# Patient Record
Sex: Female | Born: 1950 | Race: White | Hispanic: No | Marital: Single | Smoking: Former smoker
Health system: Southern US, Community
[De-identification: ages and names within clinical notes are randomized; demographics above are authoritative.]

## PROBLEM LIST (undated history)

## (undated) DIAGNOSIS — I10 Essential (primary) hypertension: Secondary | ICD-10-CM

## (undated) DIAGNOSIS — M1990 Unspecified osteoarthritis, unspecified site: Secondary | ICD-10-CM

## (undated) DIAGNOSIS — E079 Disorder of thyroid, unspecified: Secondary | ICD-10-CM

## (undated) DIAGNOSIS — E7800 Pure hypercholesterolemia, unspecified: Secondary | ICD-10-CM

## (undated) DIAGNOSIS — E119 Type 2 diabetes mellitus without complications: Secondary | ICD-10-CM

---

## 1999-08-06 ENCOUNTER — Ambulatory Visit (HOSPITAL_COMMUNITY): Admission: RE | Admit: 1999-08-06 | Discharge: 1999-08-06 | Payer: Self-pay | Admitting: Gastroenterology

## 2000-03-05 ENCOUNTER — Encounter: Admission: RE | Admit: 2000-03-05 | Discharge: 2000-03-05 | Payer: Self-pay | Admitting: Family Medicine

## 2000-03-05 ENCOUNTER — Encounter: Payer: Self-pay | Admitting: Family Medicine

## 2000-05-26 ENCOUNTER — Encounter: Payer: Self-pay | Admitting: Family Medicine

## 2000-05-26 ENCOUNTER — Encounter: Admission: RE | Admit: 2000-05-26 | Discharge: 2000-05-26 | Payer: Self-pay | Admitting: Family Medicine

## 2000-06-12 ENCOUNTER — Encounter: Admission: RE | Admit: 2000-06-12 | Discharge: 2000-06-12 | Payer: Self-pay | Admitting: Family Medicine

## 2000-06-12 ENCOUNTER — Encounter: Payer: Self-pay | Admitting: Family Medicine

## 2001-04-26 ENCOUNTER — Encounter: Payer: Self-pay | Admitting: Family Medicine

## 2001-04-26 ENCOUNTER — Encounter: Admission: RE | Admit: 2001-04-26 | Discharge: 2001-04-26 | Payer: Self-pay | Admitting: Family Medicine

## 2002-04-27 ENCOUNTER — Encounter: Admission: RE | Admit: 2002-04-27 | Discharge: 2002-04-27 | Payer: Self-pay | Admitting: Family Medicine

## 2002-04-27 ENCOUNTER — Encounter: Payer: Self-pay | Admitting: Family Medicine

## 2002-05-02 ENCOUNTER — Encounter: Payer: Self-pay | Admitting: Family Medicine

## 2002-05-02 ENCOUNTER — Encounter: Admission: RE | Admit: 2002-05-02 | Discharge: 2002-05-02 | Payer: Self-pay | Admitting: Family Medicine

## 2002-06-30 ENCOUNTER — Other Ambulatory Visit: Admission: RE | Admit: 2002-06-30 | Discharge: 2002-06-30 | Payer: Self-pay | Admitting: Family Medicine

## 2002-10-19 ENCOUNTER — Encounter: Payer: Self-pay | Admitting: Family Medicine

## 2002-10-19 ENCOUNTER — Encounter: Admission: RE | Admit: 2002-10-19 | Discharge: 2002-10-19 | Payer: Self-pay | Admitting: Family Medicine

## 2003-05-05 ENCOUNTER — Encounter: Payer: Self-pay | Admitting: Family Medicine

## 2003-05-05 ENCOUNTER — Encounter: Admission: RE | Admit: 2003-05-05 | Discharge: 2003-05-05 | Payer: Self-pay | Admitting: Family Medicine

## 2004-05-10 ENCOUNTER — Encounter: Admission: RE | Admit: 2004-05-10 | Discharge: 2004-05-10 | Payer: Self-pay | Admitting: Family Medicine

## 2005-03-03 ENCOUNTER — Other Ambulatory Visit: Admission: RE | Admit: 2005-03-03 | Discharge: 2005-03-03 | Payer: Self-pay | Admitting: Family Medicine

## 2005-05-15 ENCOUNTER — Encounter: Admission: RE | Admit: 2005-05-15 | Discharge: 2005-05-15 | Payer: Self-pay | Admitting: Family Medicine

## 2006-05-06 ENCOUNTER — Encounter: Admission: RE | Admit: 2006-05-06 | Discharge: 2006-05-06 | Payer: Self-pay | Admitting: Family Medicine

## 2006-09-23 ENCOUNTER — Encounter: Admission: RE | Admit: 2006-09-23 | Discharge: 2006-09-23 | Payer: Self-pay | Admitting: Family Medicine

## 2007-05-10 ENCOUNTER — Encounter: Admission: RE | Admit: 2007-05-10 | Discharge: 2007-05-10 | Payer: Self-pay | Admitting: Family Medicine

## 2008-05-10 ENCOUNTER — Encounter: Admission: RE | Admit: 2008-05-10 | Discharge: 2008-05-10 | Payer: Self-pay | Admitting: Family Medicine

## 2009-05-11 ENCOUNTER — Encounter: Admission: RE | Admit: 2009-05-11 | Discharge: 2009-05-11 | Payer: Self-pay | Admitting: Family Medicine

## 2009-09-12 ENCOUNTER — Ambulatory Visit (HOSPITAL_COMMUNITY): Admission: RE | Admit: 2009-09-12 | Discharge: 2009-09-12 | Payer: Self-pay | Admitting: Family Medicine

## 2010-05-13 ENCOUNTER — Encounter: Admission: RE | Admit: 2010-05-13 | Discharge: 2010-05-13 | Payer: Self-pay | Admitting: Family Medicine

## 2011-04-14 ENCOUNTER — Other Ambulatory Visit: Payer: Self-pay | Admitting: Family Medicine

## 2011-04-14 DIAGNOSIS — Z1231 Encounter for screening mammogram for malignant neoplasm of breast: Secondary | ICD-10-CM

## 2011-05-23 ENCOUNTER — Ambulatory Visit
Admission: RE | Admit: 2011-05-23 | Discharge: 2011-05-23 | Disposition: A | Discharge status: Home / Self Care | Payer: PRIVATE HEALTH INSURANCE | Source: Ambulatory Visit | Attending: Family Medicine | Admitting: Family Medicine

## 2011-05-23 DIAGNOSIS — Z1231 Encounter for screening mammogram for malignant neoplasm of breast: Secondary | ICD-10-CM

## 2012-06-18 DIAGNOSIS — I10 Essential (primary) hypertension: Secondary | ICD-10-CM | POA: Insufficient documentation

## 2013-01-05 DIAGNOSIS — E785 Hyperlipidemia, unspecified: Secondary | ICD-10-CM | POA: Insufficient documentation

## 2013-01-05 DIAGNOSIS — K219 Gastro-esophageal reflux disease without esophagitis: Secondary | ICD-10-CM | POA: Insufficient documentation

## 2013-01-05 DIAGNOSIS — E039 Hypothyroidism, unspecified: Secondary | ICD-10-CM | POA: Insufficient documentation

## 2013-02-21 DIAGNOSIS — G8929 Other chronic pain: Secondary | ICD-10-CM | POA: Insufficient documentation

## 2013-04-20 ENCOUNTER — Other Ambulatory Visit: Payer: Self-pay

## 2013-04-20 DIAGNOSIS — Z1231 Encounter for screening mammogram for malignant neoplasm of breast: Secondary | ICD-10-CM

## 2013-05-17 ENCOUNTER — Ambulatory Visit: Payer: PRIVATE HEALTH INSURANCE

## 2013-06-08 ENCOUNTER — Ambulatory Visit
Admission: RE | Admit: 2013-06-08 | Discharge: 2013-06-08 | Disposition: A | Discharge status: Home / Self Care | Payer: PRIVATE HEALTH INSURANCE | Source: Ambulatory Visit

## 2013-06-08 DIAGNOSIS — Z1231 Encounter for screening mammogram for malignant neoplasm of breast: Secondary | ICD-10-CM

## 2014-04-21 ENCOUNTER — Other Ambulatory Visit: Payer: Self-pay

## 2014-04-21 DIAGNOSIS — Z1239 Encounter for other screening for malignant neoplasm of breast: Secondary | ICD-10-CM

## 2014-06-12 ENCOUNTER — Ambulatory Visit
Admission: RE | Admit: 2014-06-12 | Discharge: 2014-06-12 | Disposition: A | Discharge status: Home / Self Care | Payer: PRIVATE HEALTH INSURANCE | Source: Ambulatory Visit

## 2014-06-12 ENCOUNTER — Other Ambulatory Visit: Payer: Self-pay

## 2014-06-12 DIAGNOSIS — Z1231 Encounter for screening mammogram for malignant neoplasm of breast: Secondary | ICD-10-CM

## 2014-11-08 DIAGNOSIS — M8580 Other specified disorders of bone density and structure, unspecified site: Secondary | ICD-10-CM | POA: Insufficient documentation

## 2015-09-03 ENCOUNTER — Other Ambulatory Visit: Payer: Self-pay

## 2015-09-03 DIAGNOSIS — Z1231 Encounter for screening mammogram for malignant neoplasm of breast: Secondary | ICD-10-CM

## 2015-09-24 ENCOUNTER — Ambulatory Visit
Admission: RE | Admit: 2015-09-24 | Discharge: 2015-09-24 | Disposition: A | Discharge status: Home / Self Care | Payer: PRIVATE HEALTH INSURANCE | Source: Ambulatory Visit

## 2015-09-24 DIAGNOSIS — Z1231 Encounter for screening mammogram for malignant neoplasm of breast: Secondary | ICD-10-CM

## 2015-12-22 ENCOUNTER — Other Ambulatory Visit: Payer: Self-pay | Admitting: Family Medicine

## 2015-12-22 DIAGNOSIS — E2839 Other primary ovarian failure: Secondary | ICD-10-CM

## 2015-12-22 DIAGNOSIS — Z780 Asymptomatic menopausal state: Secondary | ICD-10-CM

## 2016-01-08 ENCOUNTER — Ambulatory Visit
Admission: RE | Admit: 2016-01-08 | Discharge: 2016-01-08 | Disposition: A | Discharge status: Home / Self Care | Payer: PRIVATE HEALTH INSURANCE | Source: Ambulatory Visit | Attending: Family Medicine | Admitting: Family Medicine

## 2016-01-08 DIAGNOSIS — E2839 Other primary ovarian failure: Secondary | ICD-10-CM

## 2016-01-08 DIAGNOSIS — Z780 Asymptomatic menopausal state: Secondary | ICD-10-CM

## 2016-10-06 ENCOUNTER — Other Ambulatory Visit: Payer: Self-pay | Admitting: Family Medicine

## 2016-10-06 DIAGNOSIS — Z1231 Encounter for screening mammogram for malignant neoplasm of breast: Secondary | ICD-10-CM

## 2016-10-23 ENCOUNTER — Ambulatory Visit
Admission: RE | Admit: 2016-10-23 | Discharge: 2016-10-23 | Disposition: A | Discharge status: Home / Self Care | Payer: PRIVATE HEALTH INSURANCE | Source: Ambulatory Visit | Attending: Family Medicine | Admitting: Family Medicine

## 2016-10-23 DIAGNOSIS — Z1231 Encounter for screening mammogram for malignant neoplasm of breast: Secondary | ICD-10-CM

## 2017-12-15 ENCOUNTER — Other Ambulatory Visit: Payer: Self-pay | Admitting: Family Medicine

## 2017-12-15 DIAGNOSIS — Z1231 Encounter for screening mammogram for malignant neoplasm of breast: Secondary | ICD-10-CM

## 2018-01-04 ENCOUNTER — Ambulatory Visit: Payer: PRIVATE HEALTH INSURANCE

## 2018-01-20 ENCOUNTER — Ambulatory Visit
Admission: RE | Admit: 2018-01-20 | Discharge: 2018-01-20 | Disposition: A | Discharge status: Home / Self Care | Payer: PRIVATE HEALTH INSURANCE | Source: Ambulatory Visit | Attending: Family Medicine | Admitting: Family Medicine

## 2018-01-20 DIAGNOSIS — Z1231 Encounter for screening mammogram for malignant neoplasm of breast: Secondary | ICD-10-CM

## 2018-07-20 ENCOUNTER — Ambulatory Visit: Payer: Self-pay | Admitting: Nurse Practitioner

## 2018-07-20 VITALS — BP 128/70 | HR 66 | Temp 98.5°F | Resp 16

## 2018-07-20 DIAGNOSIS — I10 Essential (primary) hypertension: Secondary | ICD-10-CM

## 2018-07-20 DIAGNOSIS — R071 Chest pain on breathing: Secondary | ICD-10-CM

## 2018-07-20 DIAGNOSIS — S6991XA Unspecified injury of right wrist, hand and finger(s), initial encounter: Secondary | ICD-10-CM

## 2018-07-20 DIAGNOSIS — R0789 Other chest pain: Secondary | ICD-10-CM

## 2018-07-20 MED ORDER — LISINOPRIL-HYDROCHLOROTHIAZIDE 20-25 MG PO TABS: 1.0000 | ORAL_TABLET | Freq: Every day | ORAL | 0 refills | Status: AC

## 2018-07-20 MED ORDER — PREDNISONE 20 MG PO TABS: ORAL_TABLET | ORAL | 0 refills | Status: DC

## 2018-07-20 NOTE — Patient Instructions (Addendum)
Valerie Simpson I will send over a 90 day supply of your Lisinopril/HCTZ so that you don't go without your med As discussed, there definitely is some inflammation and we will trial a 5 day course of prednisone to see if you get relief; if no please consider seeing a neurology as I think the intercostal nerves may be affected from your incident years ago Ice your finger and continue your Meloxican as directed We discussed your history of prednisone injections causing you to have a menses and you wish to do a short trial to see if the rib cage discomfort will help Return to the clinic as needed or if symptoms worsen or persist

## 2018-07-20 NOTE — Progress Notes (Signed)
   Subjective:    Patient ID: Valerie Simpson, female    DOB: 1950-07-31, 68 y.o.   MRN: [id number]  HPI Valerie Simpson comes to the health and wellness clinic today with reports of an injury this morning where the dog leash got wrapped around her right 5th digit and she wants to make sure it isn't broken. She also request a refill on her blood pressure medication which she takes as directed and took last dose this morning. Admits has called PCP office but hasn't been done yet and waiting to have med sent to pharmacy. C/o ongoing pain to right lower rib cage that has been years. She reports it occurred 2012 when she had the flu and developed costochondritis, but has never resolved. Admits has only seen different providers at office she goes to and hasn't seen neurology in the past. She reports the pain feels like a sharp rubbing pain that is constant. Discussed med list and meds she has been put on for fibromyalgia like Gabapentin which she is no longer on and Cymbalta which has helped with fibro, but not her right lower rib cage pain. She reports she takes Meloxicam for OA and this hasn't helped that area either. Reports right lateral discomfort that sometimes radiates to the medial area but does not cross the chest wall and able to palpate area. On omeprazole for GERD. Admits has done steroids in knees and started menses.   Review of Systems  Constitutional: Negative for fatigue and fever.  Cardiovascular: Positive for chest pain. Negative for palpitations.  Musculoskeletal: Positive for arthralgias and myalgias. Negative for gait problem, neck pain and neck stiffness.  Skin: Negative for color change and rash.  Neurological: Negative for numbness.       Objective:   Physical Exam Vitals signs reviewed.  Constitutional:      Appearance: Normal appearance. She is well-developed. She is obese.  HENT:     Head: Normocephalic.  Neck:     Musculoskeletal: Normal range of motion.  Pulmonary:     Effort:  No respiratory distress.  Abdominal:     General: Bowel sounds are normal.     Palpations: Abdomen is soft.  Musculoskeletal: Normal range of motion.        General: Tenderness present.     Comments: Tenderness over right anterior 12th rib on palpation. No edema or discoloration noted. No tenderness on cupping the liver.  Right 5th digit with no discoloation or ecchymosis. Digit is smooth with no nodules. Some tenderness on palpation but FROM and strenght 4/5.  Skin:    General: Skin is warm and dry.  Neurological:     Mental Status: She is alert and oriented to person, place, and time.           Assessment & Plan:

## 2018-08-03 ENCOUNTER — Ambulatory Visit: Payer: Self-pay | Admitting: Nurse Practitioner

## 2018-08-03 VITALS — BP 122/68 | HR 77 | Temp 99.5°F | Resp 16

## 2018-08-03 DIAGNOSIS — J209 Acute bronchitis, unspecified: Secondary | ICD-10-CM

## 2018-08-03 DIAGNOSIS — H66003 Acute suppurative otitis media without spontaneous rupture of ear drum, bilateral: Secondary | ICD-10-CM

## 2018-08-03 MED ORDER — BENZONATATE 100 MG PO CAPS: ORAL_CAPSULE | ORAL | 0 refills | Status: DC

## 2018-08-03 MED ORDER — AZITHROMYCIN 250 MG PO TABS: ORAL_TABLET | ORAL | 0 refills | Status: DC

## 2018-08-03 NOTE — Progress Notes (Signed)
   Subjective:    Patient ID: Valerie Simpson, female    DOB: 1951/02/05, 68 y.o.   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 She admits over the last day the cough has worsened and moved into her chest. In the morning she produces yellow phelgm and it's a constant dry cough during the day. She has received her flu vaccine. She admits she has been taking delsym and cough drops with short term relief. She denies fever, wheezing, nasal congestion, sore throat, facial pain, body aches, myalgia or headaches.   Review of Systems  Constitutional: Positive for fever. Negative for fatigue.  HENT: Negative for congestion, ear pain and postnasal drip.   Respiratory: Positive for cough. Negative for shortness of breath and wheezing.   Cardiovascular: Positive for chest pain.       Chronic right lower rib cage pain  Neurological: Negative for dizziness and headaches.       Objective:   Physical Exam Vitals signs reviewed.  Constitutional:      General: She is not in acute distress.    Appearance: She is well-developed. She is obese. She is ill-appearing.  HENT:     Head: Normocephalic and atraumatic.     Right Ear: Ear canal normal.     Left Ear: Ear canal normal.     Ears:     Comments: Bilateral ears with cloudy fluid behind intact TM which is mildly injected. Unable to visualize ear bone structures.    Nose: Nose normal.     Mouth/Throat:     Mouth: Mucous membranes are dry.     Comments: Mildly injected Neck:     Musculoskeletal: Normal range of motion and neck supple.     Comments: No tenderness to bilateral upper anterior cervical lymphadenopathy Cardiovascular:     Rate and Rhythm: Normal rate and regular rhythm.     Heart sounds: Normal heart sounds.  Pulmonary:     Effort: Pulmonary effort is normal. No respiratory distress.     Breath sounds: No stridor. No rhonchi.     Comments: Diminished lung sounds  throughout and cough ellicited with deep inspiration Abdominal:     General: Bowel sounds are normal.     Palpations: Abdomen is soft.  Musculoskeletal: Normal range of motion.  Lymphadenopathy:     Cervical: Cervical adenopathy present.  Skin:    General: Skin is warm and dry.  Neurological:     Mental Status: She is alert and oriented to person, place, and time.  Psychiatric:        Mood and Affect: Mood normal.           Assessment & Plan:

## 2018-08-03 NOTE — Patient Instructions (Addendum)
Start over the counter claritin and take as directed to try and help with drainage Take all meds as directed Can take tylenol if it provides relief of discomfort from constant coughing Can continue cough drops if gives some relief Hot tea with honey and lemon encouraged Continue to drink plenty of fluids, rest and handwashing discussed and to continue Return to clinic or go to urgent care if symptoms persist or worsen after course of treatment

## 2018-08-10 ENCOUNTER — Ambulatory Visit: Payer: Self-pay | Admitting: Nurse Practitioner

## 2018-08-10 VITALS — BP 140/88 | HR 62 | Temp 98.1°F | Resp 16

## 2018-08-10 DIAGNOSIS — J209 Acute bronchitis, unspecified: Secondary | ICD-10-CM

## 2018-08-10 MED ORDER — ALBUTEROL SULFATE HFA 108 (90 BASE) MCG/ACT IN AERS: INHALATION_SPRAY | RESPIRATORY_TRACT | 0 refills | Status: DC

## 2018-08-10 NOTE — Progress Notes (Addendum)
   Subjective:    Patient ID: Valerie Simpson, female    DOB: Sep 25, 1950, 68 y.o.   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 She has taken all of the abx. Continues to take claritin and tessalon perrrles. She reports she feels much better but wheezing started and now has bilateral ear pain. Cough has improved. She denies fever, c/p or sore throat.    Review of Systems  Constitutional: Negative for fever.  HENT: Positive for ear pain.   Respiratory: Positive for cough and wheezing. Negative for shortness of breath.   Cardiovascular: Negative for chest pain.       Objective:   Physical Exam Vitals signs reviewed.  Constitutional:      Appearance: She is well-developed.  HENT:     Head: Normocephalic and atraumatic.     Comments: Bilateral ears with clear serous fluid    Right Ear: Ear canal normal.     Left Ear: Ear canal normal.  Neck:     Musculoskeletal: Normal range of motion.  Cardiovascular:     Rate and Rhythm: Normal rate and regular rhythm.     Heart sounds: Normal heart sounds.  Pulmonary:     Effort: Pulmonary effort is normal. No respiratory distress.     Breath sounds: Rhonchi present. No wheezing.  Chest:     Chest wall: No tenderness.  Abdominal:     General: Bowel sounds are normal.     Palpations: Abdomen is soft.  Musculoskeletal: Normal range of motion.  Lymphadenopathy:     Cervical: Cervical adenopathy present.  Skin:    General: Skin is warm and dry.  Neurological:     Mental Status: She is alert and oriented to person, place, and time.     40 mg of kenalog IM administered by provider to right gluteal. Patient tolerated injection     Assessment & Plan:

## 2018-08-10 NOTE — Patient Instructions (Signed)
To continue Claritin and Tessalon Perrles as directed Continue cough drops Increase fluid intake and continue hot tea with honey and lemon Pro-air sent to pharmacy and discussed side effects; kenalog injection given in office today and discussed risks/benefits/side effects Return to clinic in 48 hours if no improvement in symptoms or worsening

## 2018-08-12 MED ORDER — TRIAMCINOLONE ACETONIDE 40 MG/ML IJ SUSP: 40.0000 mg | Freq: Once | INTRAMUSCULAR | Status: AC

## 2018-08-12 NOTE — Addendum Note (Signed)
Addended by: Tresea Mall on: 08/12/2018 11:28 AM   Modules accepted: Orders

## 2018-10-07 ENCOUNTER — Ambulatory Visit: Payer: Self-pay | Admitting: Family Medicine

## 2018-10-07 ENCOUNTER — Encounter: Payer: Self-pay | Admitting: Family Medicine

## 2018-10-07 DIAGNOSIS — H65493 Other chronic nonsuppurative otitis media, bilateral: Secondary | ICD-10-CM

## 2018-10-07 DIAGNOSIS — J309 Allergic rhinitis, unspecified: Secondary | ICD-10-CM

## 2018-10-07 NOTE — Patient Instructions (Signed)

## 2018-10-07 NOTE — Progress Notes (Signed)
Valerie Simpson is a 68 y.o. female who presents today with 14 days of ear pressure and fullness. Patient was seen for this condition on 2 occasions in the month of January. She was initially treated with azithromycin and tessalon perles for an otitis with bronchitis, patient completed course with only mild improvement and after a week received IM triamcinolone and albuterol for continued symptoms and also advised to take . She reports that she has had to modify the previous treatment plan to Claritin every other day due to feeling very dry in her nose and throat and she admits she is not using the Flonase very regularly at home. As far as allergen exposure goes she does have known seasonal allergies, [place], and she likes to sit outdoors with her pets.     Review of Systems  Constitutional: Negative for chills, fever and malaise/fatigue.  HENT: Negative for congestion, ear discharge, ear pain, hearing loss, nosebleeds, sinus pain, sore throat and tinnitus.        Bilateral ear fullness- denies pain, draining, popping, discharge, tinnitus, or hearing loss.  Eyes: Negative.  Negative for blurred vision, double vision, photophobia, pain, discharge and redness.  Respiratory: Negative for cough, sputum production, shortness of breath and wheezing.   Cardiovascular: Negative.  Negative for chest pain.  Gastrointestinal: Negative for abdominal pain, diarrhea, nausea and vomiting.  Genitourinary: Negative for dysuria, frequency, hematuria and urgency.  Musculoskeletal: Negative for myalgias.  Skin: Negative.  Negative for itching and rash.  Neurological: Negative for headaches.  Endo/Heme/Allergies: Negative.   Psychiatric/Behavioral: Negative.   All other systems reviewed and are negative.   Bethsaida has a current medication list which includes the following prescription(s): albuterol, azithromycin, benzonatate, calcium citrate-vitamin d, vitamin d3, coenzyme q10, conjugated  estrogens, duloxetine, garlic, levothyroxine, lisinopril-hydrochlorothiazide, meloxicam, metoprolol tartrate, multivitamin, omega-3, omeprazole, potassium, prednisone, and simvastatin, and the following Facility-Administered Medications: triamcinolone acetonide. Also is allergic to penicillins; other; and sulfa antibiotics.  [name]  has no past medical history on file. Also  has no past surgical history on file.    O: Vitals:   10/07/18 1002  BP: (!) 152/82  Pulse: 76  Resp: 16  Temp: 99 F (37.2 C)     Physical Exam Vitals signs reviewed.  Constitutional:      Appearance: She is well-developed. She is not toxic-appearing.  HENT:     Head: Normocephalic.     Right Ear: Hearing, ear canal and external ear normal. No tenderness. A middle ear effusion is present. Tympanic membrane is not injected, erythematous, retracted or bulging.     Left Ear: Hearing, ear canal and external ear normal. No tenderness. A middle ear effusion is present. Tympanic membrane is not injected, erythematous, retracted or bulging.     Ears:     Comments: Some evidence of cone of light bilaterally but reduced bony landmarks through increased erythema and evidence of air pockets throughout- no pain on insertion and canal otherwise WNL- no evidence of acute perforation, bulging or other otic chronic changes- no discharge, drainage, heavy cerumen or impaction noted.    Nose: Rhinorrhea present. No congestion. Rhinorrhea is clear.     Right Sinus: No maxillary sinus tenderness or frontal sinus tenderness.     Left Sinus: No maxillary sinus tenderness or frontal sinus tenderness.     Mouth/Throat:     Lips: Pink.     Mouth: Mucous membranes are moist.     Pharynx: Uvula midline.  Eyes:  General: Lids are normal. No allergic shiner.    Conjunctiva/sclera: Conjunctivae normal.  Neck:     Musculoskeletal: Normal range of motion and neck supple.  Cardiovascular:     Rate and Rhythm: Normal rate and regular  rhythm.     Pulses: Normal pulses.     Heart sounds: Normal heart sounds.  Pulmonary:     Effort: Pulmonary effort is normal.     Breath sounds: Normal breath sounds.  Abdominal:     General: Bowel sounds are normal.     Palpations: Abdomen is soft.  Musculoskeletal: Normal range of motion.  Lymphadenopathy:     Head:     Right side of head: Tonsillar adenopathy present. No submental or submandibular adenopathy.     Left side of head: Tonsillar adenopathy present. No submental or submandibular adenopathy.     Cervical: Cervical adenopathy present.     Right cervical: Superficial cervical adenopathy present.     Left cervical: Superficial cervical adenopathy present.  Neurological:     Mental Status: She is alert and oriented to person, place, and time.    A: 1. Allergic rhinitis, unspecified seasonality, unspecified trigger   2. 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 Allergic rhinitis, unspecified seasonality, unspecified trigger Allegra 60 mg BID x 14 days trial Routine use of Flonase at least daily.  2. Chronic MEE (middle ear effusion), bilateral Discussed the importance of routine use of medications for allergies especially during allergy season. Discussed changing Claritin for Allegra which may create a less drying effect. Discussed low starting dose of 60 mg BID trial for 14 days in conjunction with Flonase routinely at least 1 spray BID or 2 sprays daily. Discussed with patient hesitance to escalate care (refer to ENT, consider another round of steroids, consider abx) if first line treatment plan has not been executed effectively. Goal is to see if routine use of antihistamines will improve symptoms. Next steps considerations include: trial of Astelin or short course decongestant, referral to ENT and now symptoms or ear process is chronic (lasting longer than 90 days) and failed conservative treatments. All of this was discussed with patient who agrees to  trial plan for seasonal allergies with increased compliance, wear a mask outside when exposed to environmental allergens, and try to reduce exposure to environmental allergens.   Discussed with patient exam findings, suspected diagnosis etiology and  reviewed recommended treatment plan and follow up, including complications and indications for urgent medical follow up and evaluation. Medications including use and indications reviewed with patient. Patient provided relevant patient education on diagnosis and/or relevant related condition that were discussed and reviewed with patient at discharge. Patient verbalized understanding of information provided and agrees with plan of care (POC), all questions answered.

## 2019-01-18 ENCOUNTER — Other Ambulatory Visit: Payer: Self-pay | Admitting: Family Medicine

## 2019-01-18 DIAGNOSIS — Z1231 Encounter for screening mammogram for malignant neoplasm of breast: Secondary | ICD-10-CM

## 2019-01-19 ENCOUNTER — Other Ambulatory Visit: Payer: Self-pay | Admitting: Family Medicine

## 2019-01-19 DIAGNOSIS — E2839 Other primary ovarian failure: Secondary | ICD-10-CM

## 2019-03-02 ENCOUNTER — Other Ambulatory Visit: Payer: Self-pay

## 2019-03-02 ENCOUNTER — Ambulatory Visit
Admission: RE | Admit: 2019-03-02 | Discharge: 2019-03-02 | Disposition: A | Discharge status: Home / Self Care | Payer: PRIVATE HEALTH INSURANCE | Source: Ambulatory Visit | Attending: Family Medicine | Admitting: Family Medicine

## 2019-03-02 DIAGNOSIS — Z1231 Encounter for screening mammogram for malignant neoplasm of breast: Secondary | ICD-10-CM

## 2019-03-07 ENCOUNTER — Ambulatory Visit
Admission: RE | Admit: 2019-03-07 | Discharge: 2019-03-07 | Disposition: A | Discharge status: Home / Self Care | Payer: PRIVATE HEALTH INSURANCE | Source: Ambulatory Visit | Attending: Family Medicine | Admitting: Family Medicine

## 2019-03-07 ENCOUNTER — Other Ambulatory Visit: Payer: Self-pay

## 2019-03-07 DIAGNOSIS — E2839 Other primary ovarian failure: Secondary | ICD-10-CM

## 2019-05-10 ENCOUNTER — Ambulatory Visit: Payer: PRIVATE HEALTH INSURANCE | Admitting: Family Medicine

## 2019-05-10 VITALS — BP 148/82 | HR 58 | Resp 16

## 2019-05-10 DIAGNOSIS — M79672 Pain in left foot: Secondary | ICD-10-CM

## 2019-05-10 DIAGNOSIS — Z4802 Encounter for removal of sutures: Secondary | ICD-10-CM

## 2019-05-10 DIAGNOSIS — I10 Essential (primary) hypertension: Secondary | ICD-10-CM

## 2019-05-10 NOTE — Progress Notes (Signed)
Subjective:     Patient ID: Valerie Simpson, female   DOB: 03/10/1951, 68 y.o.   MRN: [id number]  HPI  Valerie Simpson presents to the employee health clinic today for a check on her stitches. She reports she had a cyst removed by her dermatologist about a month ago to the crown of her head. Had a f/u appointment with them for suture removal. States since then still feels like there's a suture left but she is unable to visualize area. No pain or discharge to area. No fevers.   Valerie Simpson also reports some discomfort to left heel that has been ongoing for months. She is taking her meloxicam with no improvement. Pain to achilles area, tender to palpation and causes problems with walking. No known injury.    Patient Active Problem List   Diagnosis Date Noted  . Osteopenia 11/08/2014  . Chronic chest wall pain 02/21/2013  . GERD (gastroesophageal reflux disease) 01/05/2013  . Hyperlipidemia, unspecified 01/05/2013  . Hypothyroidism, unspecified 01/05/2013  . Essential (primary) hypertension 06/18/2012   Allergies  Allergen Reactions  . Penicillins Itching and Rash  . Other Rash    PAPER TAPE PAPER TAPE PAPER TAPE PAPER TAPE   . Sulfa Antibiotics Itching and Other (See Comments)    States placed on chart       Review of Systems  Constitutional: Negative for chills and fever.  Musculoskeletal: Negative for gait problem and joint swelling.  Skin: Negative for rash and wound.  Neurological: Negative for numbness.       Objective:   Physical Exam Vitals signs reviewed.  Constitutional:      General: She is not in acute distress.    Appearance: She is obese. She is not toxic-appearing.  HENT:     Head: Normocephalic and atraumatic.  Pulmonary:     Effort: Pulmonary effort is normal. No respiratory distress.  Musculoskeletal:        General: No swelling.     Comments: Left heel with tenderness over achilles tendon. No swelling, erythema, or ecchymosis. ROM intact. Normal gait. No ankle  pain.   Skin:    General: Skin is warm and dry.     Comments: Well healed area to crown of head, no evidence of infection, no surrounding erythema or purulent discharge noted. Small suture visualized that was still attached to patient's hair, easily removed.   Neurological:     Mental Status: She is alert and oriented to person, place, and time.  Psychiatric:        Mood and Affect: Mood normal.        Behavior: Behavior normal.        Assessment:     1. Visit for suture removal  2. Pain of left heel  3. Essential Hypertension     Plan:     1. Visible suture from cyst removal easily removed, no evidence of infection. Area appears well healed. F/u as needed.   2. Left heel pain likely inflammation of achilles tendon. Continue her meloxicam. Recommend ice application, rest, stretches. Start walking regimen slowly and increase as tolerated. F/u in 2 weeks if no improvement. At that point may consider referral to ortho or physical therapy.   3. Elevated BP in office today. Pt compliant with medication regimen. States BP normally around 140/80. Pt to schedule appointment with her PCP to f/u on this. Recommend she keep a log of her BP to bring with her to this appointment. If BP significantly elevated and symptomatic she should  go to ED. Pt verbalized understanding.

## 2019-09-20 ENCOUNTER — Encounter: Payer: Self-pay | Admitting: Family Medicine

## 2019-09-20 ENCOUNTER — Ambulatory Visit: Payer: PRIVATE HEALTH INSURANCE | Admitting: Family Medicine

## 2019-09-20 VITALS — BP 156/90 | HR 59

## 2019-09-20 DIAGNOSIS — Z789 Other specified health status: Secondary | ICD-10-CM

## 2019-09-20 NOTE — Progress Notes (Signed)
Subjective:     Patient ID: Valerie Simpson, female   DOB: 1950/10/24, 69 y.o.   MRN: [id number]  HPI Valerie Simpson presents to the employee health clinic for her required wellness visit for her insurance. Her PCP is Dr. Earnest Bailey, who she sees yearly for her physical. Last seen in June 2020, had lab work completed then. She reports her BP is often in the 140s / 80s at home. She is compliant with her medication. She is working on getting back into a walking routine, she had issues with achilles tendonitis that she received treatment for. She denies any problems or concerns today. It has been greater than 10 years since her last colonoscopy, she states she will schedule this to be done this year. Reports her mammogram is UTD. She no longer needs pap smear done.   No past medical history on file. Allergies  Allergen Reactions  . Penicillins Itching and Rash  . Other Rash    PAPER TAPE PAPER TAPE PAPER TAPE PAPER TAPE   . Levaquin [Levofloxacin] Other (See Comments)    Abdominal pain  . Sulfa Antibiotics Itching and Other (See Comments)    States placed on chart     Current Outpatient Medications:  .  aspirin EC 81 MG tablet, Take 81 mg by mouth daily., Disp: , Rfl:  .  Boswellia-Glucosamine-Vit D (OSTEO BI-FLEX ONE PER DAY PO), Take by mouth., Disp: , Rfl:  .  calcium citrate-vitamin D (CITRACAL+D) 315-200 MG-UNIT tablet, Take by mouth., Disp: , Rfl:  .  Cholecalciferol (VITAMIN D3) 25 MCG (1000 UT) CAPS, Take by mouth., Disp: , Rfl:  .  Coenzyme Q10 200 MG capsule, Take by mouth., Disp: , Rfl:  .  DULoxetine (CYMBALTA) 30 MG capsule, Take by mouth., Disp: , Rfl:  .  Garlic (GARLIQUE) 400 MG TBEC, Take by mouth., Disp: , Rfl:  .  levothyroxine (SYNTHROID, LEVOTHROID) 100 MCG tablet, Take by mouth., Disp: , Rfl:  .  meloxicam (MOBIC) 15 MG tablet, Take by mouth., Disp: , Rfl:  .  metoprolol tartrate (LOPRESSOR) 50 MG tablet, TAKE ONE TABLET BY MOUTH TWICE A DAY, Disp: , Rfl:  .  Multiple  Vitamin (MULTIVITAMIN) capsule, Take by mouth., Disp: , Rfl:  .  Omega-3 1000 MG CAPS, Take by mouth., Disp: , Rfl:  .  omeprazole (PRILOSEC) 20 MG capsule, Take by mouth., Disp: , Rfl:  .  Potassium 99 MG TABS, Take by mouth., Disp: , Rfl:  .  Probiotic Product (PROBIOTIC ADVANCED PO), Take by mouth., Disp: , Rfl:  .  simvastatin (ZOCOR) 40 MG tablet, TAKE ONE TABLET BY MOUTH EVERY NIGHT AT BEDTIME, Disp: , Rfl:  .  TURMERIC CURCUMIN PO, Take by mouth., Disp: , Rfl:  .  vitamin C (ASCORBIC ACID) 250 MG tablet, Take 250 mg by mouth daily., Disp: , Rfl:  .  conjugated estrogens (PREMARIN) vaginal cream, Place vaginally., Disp: , Rfl:  .  lisinopril-hydrochlorothiazide (PRINZIDE,ZESTORETIC) 20-25 MG tablet, Take 1 tablet by mouth daily., Disp: 90 tablet, Rfl: 0  Current Facility-Administered Medications:  .  triamcinolone acetonide (KENALOG-40) injection 40 mg, 40 mg, Intramuscular, Once, Bartorelli, Monica, NP  Review of Systems  Constitutional: Negative for chills, fatigue, fever and unexpected weight change.  HENT: Negative for congestion, ear pain, sinus pressure, sinus pain and sore throat.   Eyes: Negative for discharge and visual disturbance.  Respiratory: Negative for cough, shortness of breath and wheezing.   Cardiovascular: Negative for chest pain and leg swelling.  Gastrointestinal: Negative  for abdominal pain, blood in stool, constipation, diarrhea, nausea and vomiting.  Genitourinary: Negative for difficulty urinating and hematuria.  Skin: Negative for color change.  Neurological: Negative for dizziness, weakness, light-headedness and headaches.  Hematological: Negative for adenopathy.  All other systems reviewed and are negative.      Objective:   Physical Exam Vitals reviewed.  Constitutional:      General: She is not in acute distress.    Appearance: Normal appearance. She is well-developed.  HENT:     Head: Normocephalic and atraumatic.  Cardiovascular:     Rate  and Rhythm: Normal rate and regular rhythm.     Heart sounds: Normal heart sounds. No murmur.  Pulmonary:     Effort: Pulmonary effort is normal. No respiratory distress.     Breath sounds: Normal breath sounds.  Musculoskeletal:     Cervical back: Neck supple.  Skin:    General: Skin is warm and dry.  Neurological:     Mental Status: She is alert and oriented to person, place, and time.  Psychiatric:        Mood and Affect: Mood normal.        Behavior: Behavior normal.        Assessment:     Participant in health and wellness plan      Plan:     1. Keep all appts with PCP.  2. If BP remains elevated at home in the 140s she should contact PCP and may need some medication adjustment to keep this under better control. Encouraged healthy diet and exercise.  3. F/u here prn.

## 2020-04-25 ENCOUNTER — Other Ambulatory Visit: Payer: Self-pay | Admitting: Family Medicine

## 2020-04-25 DIAGNOSIS — Z1231 Encounter for screening mammogram for malignant neoplasm of breast: Secondary | ICD-10-CM

## 2020-05-18 ENCOUNTER — Ambulatory Visit: Payer: PRIVATE HEALTH INSURANCE

## 2020-06-26 ENCOUNTER — Other Ambulatory Visit: Payer: Self-pay

## 2020-06-26 ENCOUNTER — Ambulatory Visit
Admission: RE | Admit: 2020-06-26 | Discharge: 2020-06-26 | Disposition: A | Discharge status: Home / Self Care | Payer: BC Managed Care – PPO | Source: Ambulatory Visit | Attending: Family Medicine | Admitting: Family Medicine

## 2020-06-26 DIAGNOSIS — Z1231 Encounter for screening mammogram for malignant neoplasm of breast: Secondary | ICD-10-CM

## 2021-09-18 ENCOUNTER — Other Ambulatory Visit: Payer: Self-pay | Admitting: Family Medicine

## 2021-09-18 DIAGNOSIS — Z1231 Encounter for screening mammogram for malignant neoplasm of breast: Secondary | ICD-10-CM

## 2021-10-02 ENCOUNTER — Ambulatory Visit
Admission: RE | Admit: 2021-10-02 | Discharge: 2021-10-02 | Disposition: A | Discharge status: Home / Self Care | Payer: Medicare Other | Source: Ambulatory Visit | Attending: Family Medicine | Admitting: Family Medicine

## 2021-10-02 DIAGNOSIS — Z1231 Encounter for screening mammogram for malignant neoplasm of breast: Secondary | ICD-10-CM

## 2021-12-08 ENCOUNTER — Other Ambulatory Visit: Payer: Self-pay

## 2021-12-08 ENCOUNTER — Emergency Department (HOSPITAL_BASED_OUTPATIENT_CLINIC_OR_DEPARTMENT_OTHER)
Admission: EM | Admit: 2021-12-08 | Discharge: 2021-12-08 | Disposition: A | Discharge status: Home / Self Care | Payer: Medicare Other | Attending: Emergency Medicine | Admitting: Emergency Medicine

## 2021-12-08 ENCOUNTER — Encounter (HOSPITAL_BASED_OUTPATIENT_CLINIC_OR_DEPARTMENT_OTHER): Payer: Self-pay | Admitting: Emergency Medicine

## 2021-12-08 DIAGNOSIS — Z7982 Long term (current) use of aspirin: Secondary | ICD-10-CM | POA: Diagnosis not present

## 2021-12-08 DIAGNOSIS — S61210A Laceration without foreign body of right index finger without damage to nail, initial encounter: Secondary | ICD-10-CM | POA: Diagnosis not present

## 2021-12-08 DIAGNOSIS — W268XXA Contact with other sharp object(s), not elsewhere classified, initial encounter: Secondary | ICD-10-CM | POA: Diagnosis not present

## 2021-12-08 DIAGNOSIS — S6991XA Unspecified injury of right wrist, hand and finger(s), initial encounter: Secondary | ICD-10-CM | POA: Diagnosis present

## 2021-12-08 HISTORY — DX: Type 2 diabetes mellitus without complications: E11.9

## 2021-12-08 HISTORY — DX: Essential (primary) hypertension: I10

## 2021-12-08 HISTORY — DX: Pure hypercholesterolemia, unspecified: E78.00

## 2021-12-08 HISTORY — DX: Disorder of thyroid, unspecified: E07.9

## 2021-12-08 HISTORY — DX: Unspecified osteoarthritis, unspecified site: M19.90

## 2021-12-08 MED ORDER — LIDOCAINE HCL (PF) 1 % IJ SOLN
10.0000 mL | Freq: Once | INTRAMUSCULAR | Status: AC
  Administered 2021-12-08: 10 mL
  Filled 2021-12-08: qty 10

## 2021-12-08 MED ORDER — BACITRACIN ZINC 500 UNIT/GM EX OINT: TOPICAL_OINTMENT | Freq: Once | CUTANEOUS | Status: DC

## 2021-12-08 NOTE — Discharge Instructions (Signed)
Please use Tylenol or ibuprofen for pain.  You may use 600 mg ibuprofen every 6 hours or 1000 mg of Tylenol every 6 hours.  You may choose to alternate between the 2.  This would be most effective.  Not to exceed 4 g of Tylenol within 24 hours.  Not to exceed 3200 mg ibuprofen 24 hours.  Apply thin layer of Neosporin, Polysporin once to twice daily and change the bandage.  You can clean the wound gently with soap and water prior to bandage change.  Monitor for signs of infection including worsening redness, swelling, purulent drainage, worsening pain.  Follow-up for suture removal in around 7 days as we discussed with primary care, urgent care, or return to emergency department

## 2021-12-08 NOTE — ED Triage Notes (Signed)
Pt cut finger right /index on meat cleaver (was clean).

## 2021-12-08 NOTE — ED Provider Notes (Signed)
[facility] EMERGENCY DEPT Provider Note   CSN: [id number] Arrival date & time: 12/08/21  1841     History  Chief Complaint  Patient presents with   Laceration    Valerie Simpson is a 71 y.o. female with no significant past medical history who is up-to-date on her tetanus presents with concern for laceration of the right index finger sustained while cleaning her dishes tonight.  Patient reports that she was cleaning her meat Cleaver when it sliced the side of her finger.  She arrives with the finger bandaged by her neighbor who had some gauze and other supplies available.  Patient reports no difficulty with sensation of the finger.  She can bend it without difficulty.   Laceration     Home Medications Prior to Admission medications   Medication Sig Start Date End Date Taking? Authorizing Provider  aspirin EC 81 MG tablet Take 81 mg by mouth daily.    [provider]  Boswellia-Glucosamine-Vit D (OSTEO BI-FLEX ONE PER DAY PO) Take by mouth.    [provider]  calcium citrate-vitamin D (CITRACAL+D) 315-200 MG-UNIT tablet Take by mouth.    [provider]  Cholecalciferol (VITAMIN D3) 25 MCG (1000 UT) CAPS Take by mouth.    [provider]  Coenzyme Q10 200 MG capsule Take by mouth.    [provider]  conjugated estrogens (PREMARIN) vaginal cream Place vaginally. 05/21/16   [provider]  DULoxetine (CYMBALTA) 30 MG capsule Take by mouth. 01/20/18   [provider]  Garlic (GARLIQUE) 400 MG TBEC Take by mouth.    [provider]  levothyroxine (SYNTHROID, LEVOTHROID) 100 MCG tablet Take by mouth. 11/15/15   [provider]  lisinopril-hydrochlorothiazide (PRINZIDE,ZESTORETIC) 20-25 MG tablet Take 1 tablet by mouth daily. 07/20/18 10/18/18  Marnette Burgess, NP  meloxicam (MOBIC) 15 MG tablet Take by mouth. 01/20/18   [provider]  metoprolol tartrate (LOPRESSOR) 50 MG tablet TAKE  ONE TABLET BY MOUTH TWICE A DAY 10/12/15   [provider]  Multiple Vitamin (MULTIVITAMIN) capsule Take by mouth.    [provider]  Omega-3 1000 MG CAPS Take by mouth.    [provider]  omeprazole (PRILOSEC) 20 MG capsule Take by mouth.    [provider]  Potassium 99 MG TABS Take by mouth. 11/09/14   [provider]  Probiotic Product (PROBIOTIC ADVANCED PO) Take by mouth.    [provider]  simvastatin (ZOCOR) 40 MG tablet TAKE ONE TABLET BY MOUTH EVERY NIGHT AT BEDTIME 09/12/15   [provider]  TURMERIC CURCUMIN PO Take by mouth.    [provider]  vitamin C (ASCORBIC ACID) 250 MG tablet Take 250 mg by mouth daily.    [provider]      Allergies    Penicillins, Other, Levaquin [levofloxacin], and Sulfa antibiotics    Review of Systems   Review of Systems  Skin:  Positive for wound.  All other systems reviewed and are negative.  Physical Exam Updated Vital Signs BP (!) 180/77   Pulse 71   Temp 98.1 F (36.7 C) (Oral)   Resp 16   SpO2 98%  Physical Exam Vitals and nursing note reviewed.  Constitutional:      General: She is not in acute distress.    Appearance: Normal appearance.  HENT:     Head: Normocephalic and atraumatic.  Eyes:     General:        Right eye: No  discharge.        Left eye: No discharge.  Cardiovascular:     Rate and Rhythm: Normal rate and regular rhythm.  Pulmonary:     Effort: Pulmonary effort is normal. No respiratory distress.  Musculoskeletal:        General: No deformity.     Comments: Intact strength to flexion, extension of the affected index finger, range of motion to flexion extension of all phalanx segments.  Skin:    General: Skin is warm and dry.     Comments: Within approximately 3 to 4 cm laceration on the lateral aspect of the right index finger.  It is a very clean cut, no evidence of tendon involvement, no evidence of retained foreign body.   It is actively bleeding at time my evaluation.  Neurological:     Mental Status: She is alert and oriented to person, place, and time.  Psychiatric:        Mood and Affect: Mood normal.        Behavior: Behavior normal.    ED Results / Procedures / Treatments   Labs (all labs ordered are listed, but only abnormal results are displayed) Labs Reviewed - No data to display  EKG None  Radiology No results found.  Procedures .Marland Kitchen.Laceration Repair  Date/Time: 12/08/2021 8:11 PM Performed by: Olene FlossProsperi, Lexie Morini H, PA-C Authorized by: Olene FlossProsperi, Koral Thaden H, PA-C   Consent:    Consent obtained:  Verbal   Consent given by:  Patient   Risks, benefits, and alternatives were discussed: yes     Risks discussed:  Infection, pain, poor cosmetic result, need for additional repair, poor wound healing, nerve damage and tendon damage   Alternatives discussed:  No treatment Universal protocol:    Procedure explained and questions answered to patient or proxy's satisfaction: yes     Patient identity confirmed:  Verbally with patient Anesthesia:    Anesthesia method:  Nerve block Laceration details:    Location:  Finger   Finger location:  R index finger   Length (cm):  2.8   Depth (mm):  4 Treatment:    Area cleansed with:  Shur-Clens and saline   Amount of cleaning:  Standard Skin repair:    Repair method:  Sutures   Suture size:  5-0   Suture material:  Prolene   Suture technique:  Simple interrupted   Number of sutures:  5 Approximation:    Approximation:  Close Repair type:    Repair type:  Simple Post-procedure details:    Dressing:  Non-adherent dressing and antibiotic ointment   Procedure completion:  Tolerated .Nerve Block  Date/Time: 12/08/2021 8:13 PM Performed by: Olene FlossProsperi, Zia Kanner H, PA-C Authorized by: Olene FlossProsperi, Ishani Goldwasser H, PA-C   Consent:    Consent obtained:  Verbal   Consent given by:  Patient   Risks, benefits, and alternatives were discussed: yes      Risks discussed:  Infection, nerve damage, pain, intravenous injection and unsuccessful block   Alternatives discussed:  No treatment Universal protocol:    Procedure explained and questions answered to patient or proxy's satisfaction: yes     Patient identity confirmed:  Verbally with patient Indications:    Indications:  Procedural anesthesia Location:    Nerve block body site: digital block of index finger.   Laterality:  Right Pre-procedure details:    Skin preparation:  Alcohol Skin anesthesia:    Skin anesthesia method:  Local infiltration   Local anesthetic:  Lidocaine 1% w/o epi Procedure details:  Block needle gauge:  25 G   Injection procedure:  Anatomic landmarks identified, incremental injection, negative aspiration for blood, introduced needle and anatomic landmarks palpated   Paresthesia:  None Post-procedure details:    Outcome:  Anesthesia achieved (minimal retained sensation noted)   Procedure completion:  Tolerated    Medications Ordered in ED Medications  bacitracin ointment (has no administration in time range)  lidocaine (PF) (XYLOCAINE) 1 % injection 10 mL (10 mLs Infiltration Given 12/08/21 1912)    ED Course/ Medical Decision Making/ A&P                           Medical Decision Making  This is an overall well-appearing female who presents with concern for laceration of the right index finger. She is up-to-date on her tetanus.  She has no signs of neurovascular compromise. Laceration repaired as described above.  Patient tolerated nerve block without difficulty.  Low clinical suspicion for tendon injury, or underlying fracture.  No evidence of retained foreign body.  Encouraged daily to twice daily wound care, and return precautions given for signs of infection. Final Clinical Impression(s) / ED Diagnoses Final diagnoses:  Laceration of right index finger without foreign body without damage to nail, initial encounter    Rx / DC Orders ED Discharge  Orders     None         [place] 12/08/21 2015    Ernie Avena, MD 12/08/21 2031

## 2022-02-26 ENCOUNTER — Other Ambulatory Visit: Payer: Self-pay | Admitting: Family Medicine

## 2022-02-26 DIAGNOSIS — Z1231 Encounter for screening mammogram for malignant neoplasm of breast: Secondary | ICD-10-CM

## 2022-02-26 DIAGNOSIS — M8580 Other specified disorders of bone density and structure, unspecified site: Secondary | ICD-10-CM

## 2022-10-06 ENCOUNTER — Ambulatory Visit
Admission: RE | Admit: 2022-10-06 | Discharge: 2022-10-06 | Disposition: A | Discharge status: Home / Self Care | Payer: Medicare Other | Source: Ambulatory Visit | Attending: Family Medicine | Admitting: Family Medicine

## 2022-10-06 DIAGNOSIS — Z1231 Encounter for screening mammogram for malignant neoplasm of breast: Secondary | ICD-10-CM

## 2022-10-06 DIAGNOSIS — M8580 Other specified disorders of bone density and structure, unspecified site: Secondary | ICD-10-CM

## 2023-04-27 LAB — COLOGUARD

## 2023-05-26 LAB — COLOGUARD: COLOGUARD: NEGATIVE

## 2023-09-28 IMAGING — MG MM DIGITAL SCREENING BILAT W/ TOMO AND CAD
8 series · 8 of 24 positions shown · non-contrast
Comparison: Previous exam(s).

ACR Breast Density Category a: The breast tissue is almost entirely
fatty.

CLINICAL DATA: Screening.

EXAM:
DIGITAL SCREENING BILATERAL MAMMOGRAM WITH TOMOSYNTHESIS AND CAD
TECHNIQUE: Bilateral screening digital craniocaudal and mediolateral oblique
mammograms were obtained. Bilateral screening digital breast
tomosynthesis was performed. The images were evaluated with
computer-aided detection.

[R MLO synth-2D]
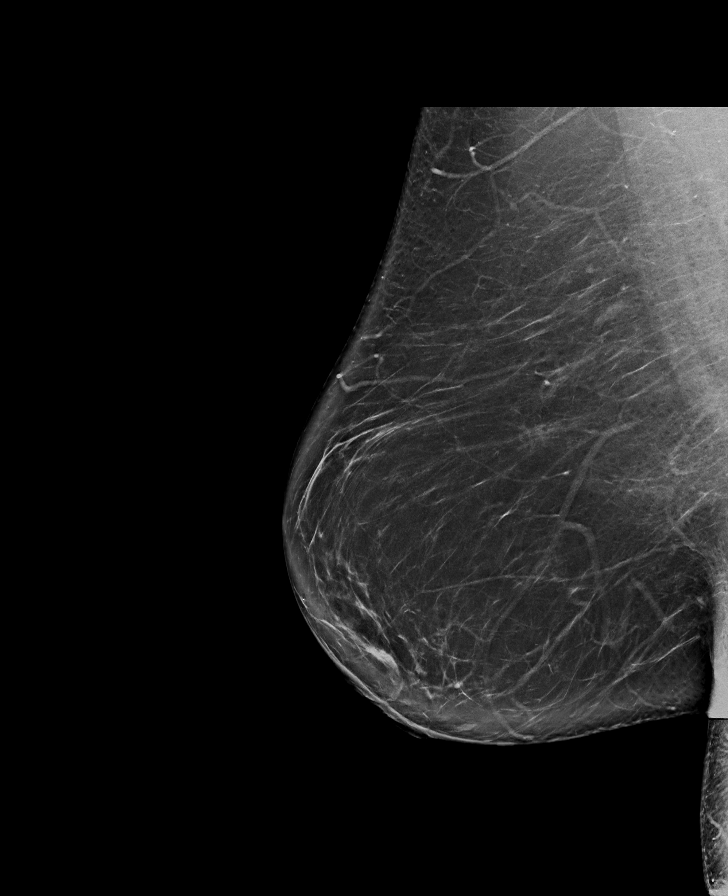

[L CC synth-2D]
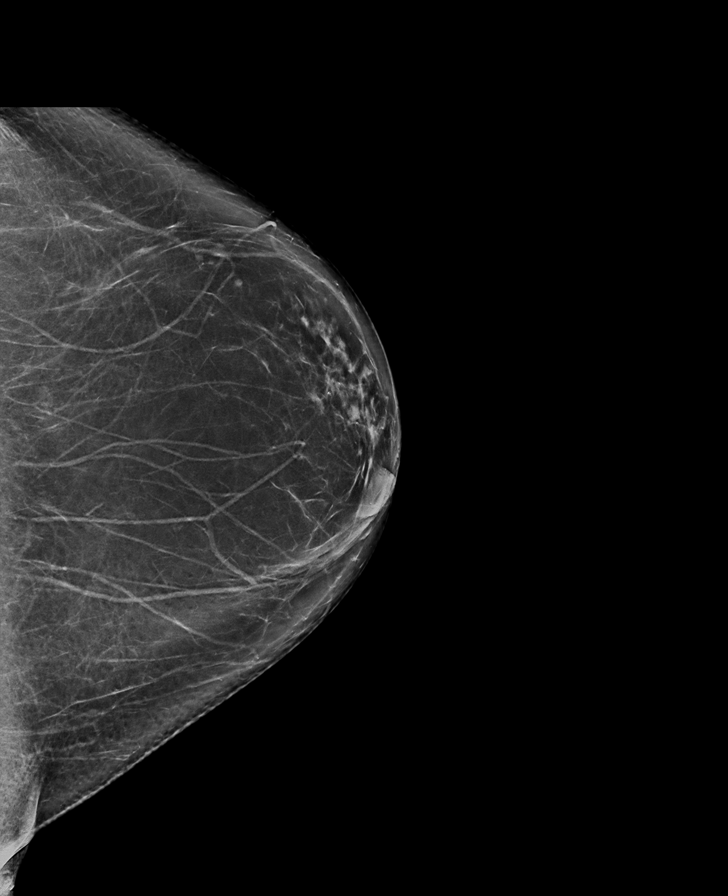

[L MLO synth-2D]
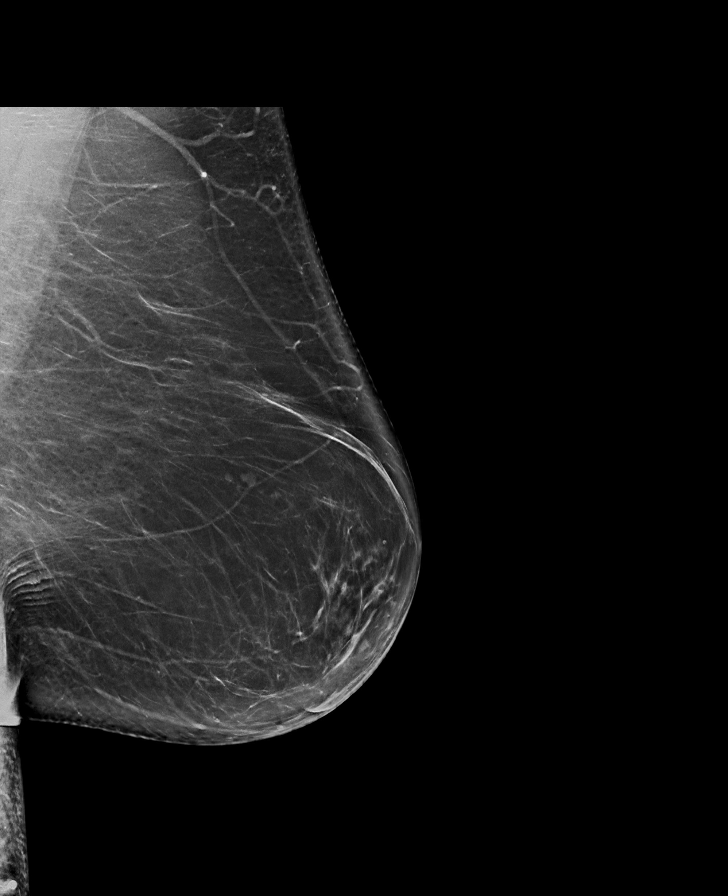

[R CC synth-2D]
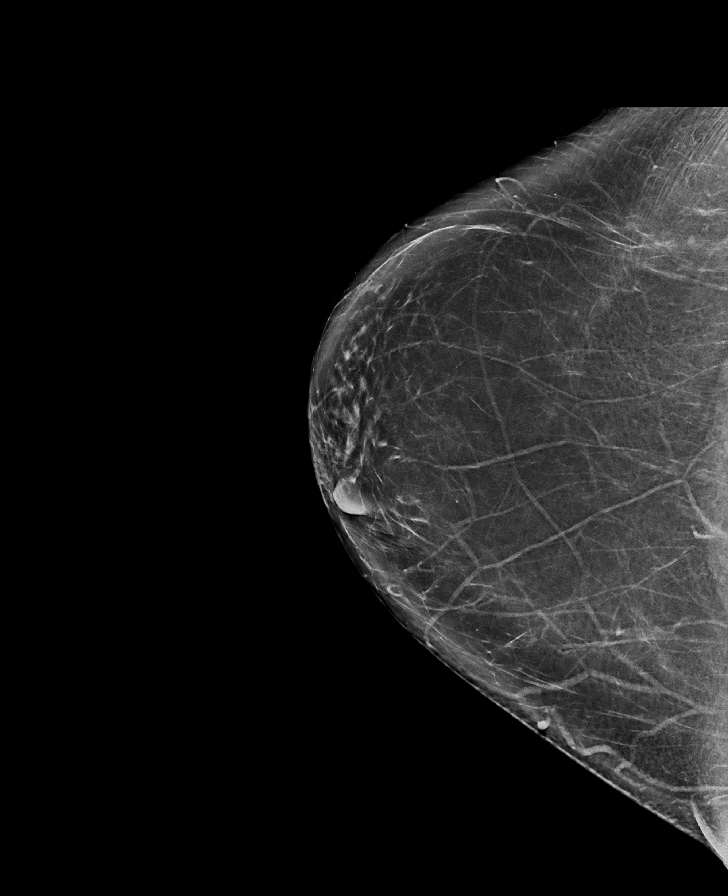

[L MLO tomo · tomo slice 47/94.0]
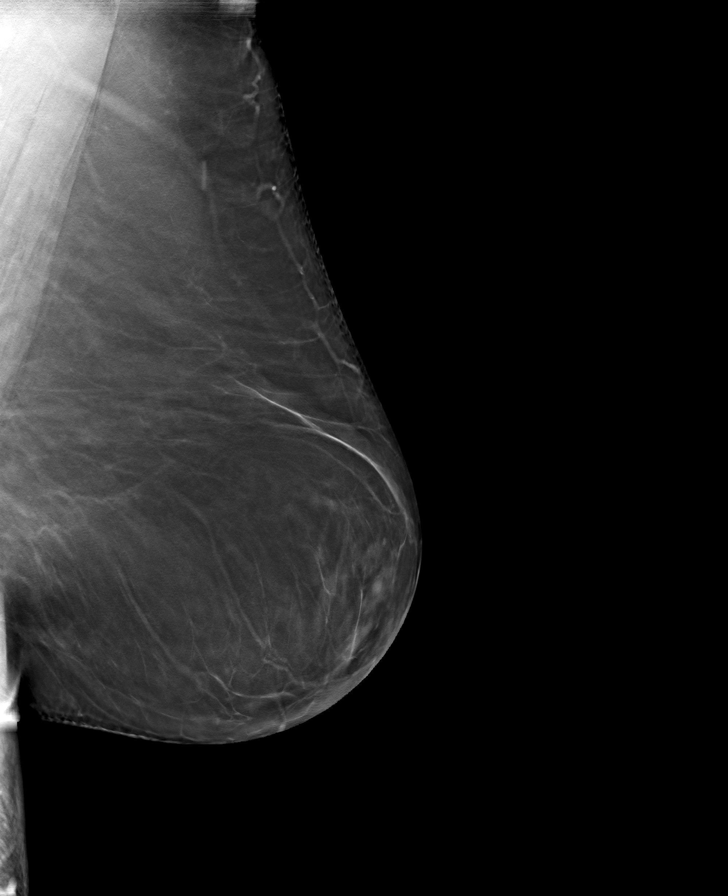

[R MLO tomo · tomo slice 47/94.0]
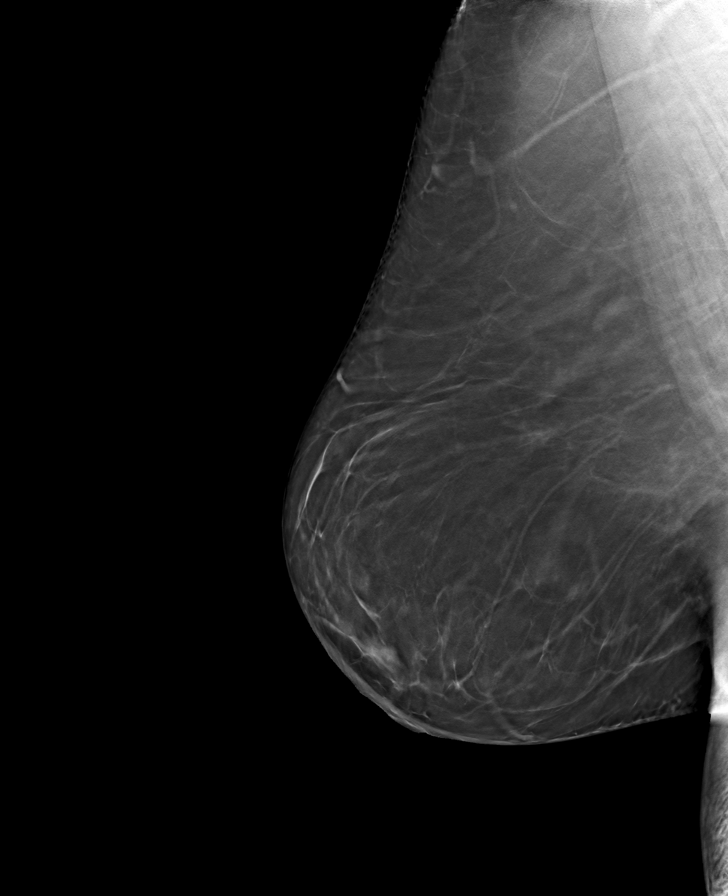

[L CC tomo · tomo slice 41/80.0]
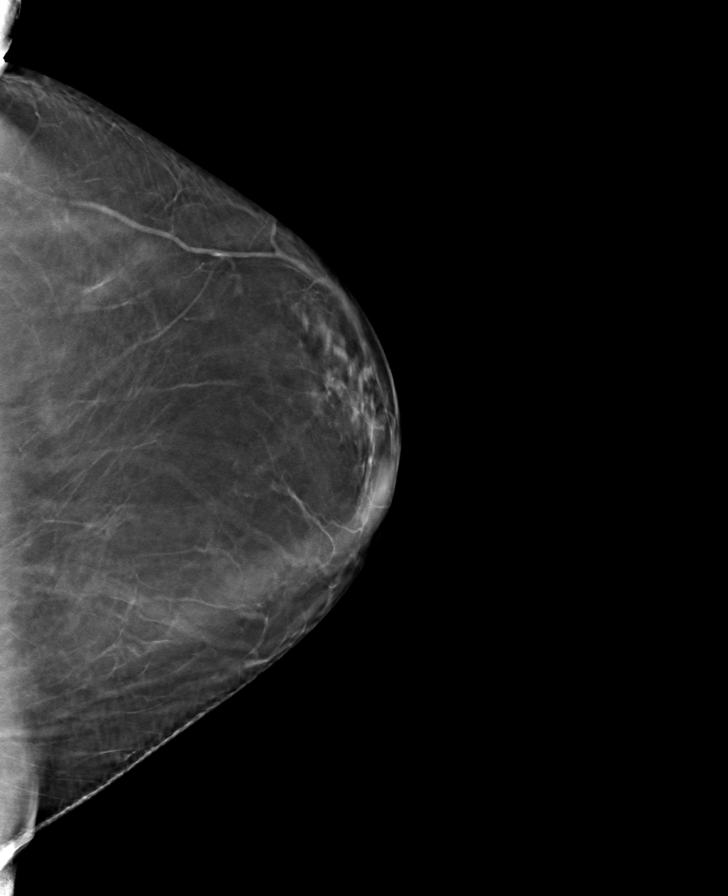

[R CC tomo · tomo slice 44/87.0]
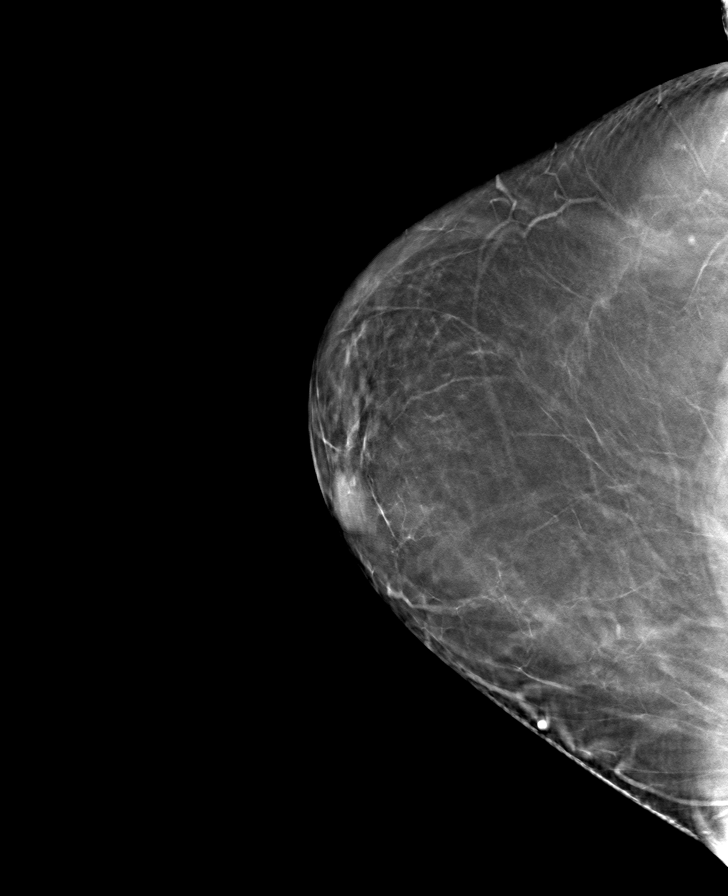

[8 of 24 positions shown; findings below may reference images not displayed]

FINDINGS: There are no findings suspicious for malignancy.
IMPRESSION: No mammographic evidence of malignancy. A result letter of this
screening mammogram will be mailed directly to the patient.

RECOMMENDATION:
Screening mammogram in one year. (Code:0E-3-N98)

BI-RADS CATEGORY  1: Negative.

## 2023-12-09 ENCOUNTER — Other Ambulatory Visit: Payer: Self-pay | Admitting: Family Medicine

## 2023-12-09 DIAGNOSIS — Z1231 Encounter for screening mammogram for malignant neoplasm of breast: Secondary | ICD-10-CM

## 2023-12-14 ENCOUNTER — Ambulatory Visit
Admission: RE | Admit: 2023-12-14 | Discharge: 2023-12-14 | Disposition: A | Discharge status: Home / Self Care | Source: Ambulatory Visit | Attending: Family Medicine | Admitting: Family Medicine

## 2023-12-14 DIAGNOSIS — Z1231 Encounter for screening mammogram for malignant neoplasm of breast: Secondary | ICD-10-CM
